# Patient Record
Sex: Male | Born: 1965 | Hispanic: No | Marital: Married | Smoking: Never smoker
Health system: Southern US, Community
[De-identification: ages and names within clinical notes are randomized; demographics above are authoritative.]

---

## 1998-09-02 ENCOUNTER — Emergency Department (HOSPITAL_COMMUNITY): Admission: EM | Admit: 1998-09-02 | Discharge: 1998-09-02 | Payer: Self-pay

## 2001-01-23 ENCOUNTER — Emergency Department (HOSPITAL_COMMUNITY): Admission: EM | Admit: 2001-01-23 | Discharge: 2001-01-23 | Payer: Self-pay | Admitting: Emergency Medicine

## 2001-01-23 ENCOUNTER — Encounter: Payer: Self-pay | Admitting: Emergency Medicine

## 2001-02-01 ENCOUNTER — Emergency Department (HOSPITAL_COMMUNITY): Admission: EM | Admit: 2001-02-01 | Discharge: 2001-02-01 | Payer: Self-pay | Admitting: Emergency Medicine

## 2020-06-02 ENCOUNTER — Emergency Department (HOSPITAL_COMMUNITY)
Admission: EM | Admit: 2020-06-02 | Discharge: 2020-06-03 | Disposition: A | Discharge status: Home / Self Care | Payer: 59 | Attending: Emergency Medicine | Admitting: Emergency Medicine

## 2020-06-02 ENCOUNTER — Other Ambulatory Visit: Payer: Self-pay

## 2020-06-02 ENCOUNTER — Encounter (HOSPITAL_COMMUNITY): Payer: Self-pay | Admitting: Emergency Medicine

## 2020-06-02 DIAGNOSIS — K649 Unspecified hemorrhoids: Secondary | ICD-10-CM | POA: Insufficient documentation

## 2020-06-02 DIAGNOSIS — K611 Rectal abscess: Secondary | ICD-10-CM

## 2020-06-02 DIAGNOSIS — K6289 Other specified diseases of anus and rectum: Secondary | ICD-10-CM | POA: Diagnosis present

## 2020-06-02 DIAGNOSIS — M549 Dorsalgia, unspecified: Secondary | ICD-10-CM

## 2020-06-02 NOTE — ED Triage Notes (Signed)
Patient here from home reporting pain to hemorrhoids, denies bleeding. Hx of same with Chron's

## 2020-06-03 ENCOUNTER — Emergency Department (HOSPITAL_COMMUNITY): Payer: 59

## 2020-06-03 DIAGNOSIS — K611 Rectal abscess: Secondary | ICD-10-CM | POA: Diagnosis not present

## 2020-06-03 LAB — CBC WITH DIFFERENTIAL/PLATELET
Abs Immature Granulocytes: 0.03 10*3/uL (ref 0.00–0.07)
Basophils Absolute: 0 10*3/uL (ref 0.0–0.1)
Basophils Relative: 0 %
Eosinophils Absolute: 0.3 10*3/uL (ref 0.0–0.5)
Eosinophils Relative: 2 %
HCT: 38.5 % — ABNORMAL LOW (ref 39.0–52.0)
Hemoglobin: 13.2 g/dL (ref 13.0–17.0)
Immature Granulocytes: 0 %
Lymphocytes Relative: 18 %
Lymphs Abs: 2.1 10*3/uL (ref 0.7–4.0)
MCH: 28.4 pg (ref 26.0–34.0)
MCHC: 34.3 g/dL (ref 30.0–36.0)
MCV: 82.8 fL (ref 80.0–100.0)
Monocytes Absolute: 0.8 10*3/uL (ref 0.1–1.0)
Monocytes Relative: 7 %
Neutro Abs: 8.2 10*3/uL — ABNORMAL HIGH (ref 1.7–7.7)
Neutrophils Relative %: 73 %
Platelets: 216 10*3/uL (ref 150–400)
RBC: 4.65 MIL/uL (ref 4.22–5.81)
RDW: 12 % (ref 11.5–15.5)
WBC: 11.4 10*3/uL — ABNORMAL HIGH (ref 4.0–10.5)
nRBC: 0 % (ref 0.0–0.2)

## 2020-06-03 LAB — BASIC METABOLIC PANEL
Anion gap: 8 (ref 5–15)
BUN: 16 mg/dL (ref 6–20)
CO2: 27 mmol/L (ref 22–32)
Calcium: 9.2 mg/dL (ref 8.9–10.3)
Chloride: 108 mmol/L (ref 98–111)
Creatinine, Ser: 1.26 mg/dL — ABNORMAL HIGH (ref 0.61–1.24)
GFR, Estimated: >60 mL/min (ref >60)
Glucose, Bld: 106 mg/dL — ABNORMAL HIGH (ref 70–99)
Potassium: 3.9 mmol/L (ref 3.5–5.1)
Sodium: 143 mmol/L (ref 135–145)

## 2020-06-03 MED ORDER — AMOXICILLIN-POT CLAVULANATE 875-125 MG PO TABS
1.0000 | ORAL_TABLET | Freq: Once | ORAL | Status: AC
  Administered 2020-06-03: 1 via ORAL
  Filled 2020-06-03: qty 1

## 2020-06-03 MED ORDER — AMOXICILLIN-POT CLAVULANATE 875-125 MG PO TABS: 1.0000 | ORAL_TABLET | Freq: Two times a day (BID) | ORAL | 0 refills | Status: AC

## 2020-06-03 MED ORDER — LIDOCAINE HCL URETHRAL/MUCOSAL 2 % EX GEL: 1.0000 "application " | CUTANEOUS | 0 refills | Status: AC | PRN

## 2020-06-03 NOTE — ED Notes (Signed)
Pt discharged from this ED in stable condition at this time. All discharge instructions and follow up care reviewed with pt with no further questions at this time. Pt ambulatory with steady gait, clear speech.  

## 2020-06-03 NOTE — ED Provider Notes (Signed)
[facility]-EMERGENCY DEPT Provider Note   CSN: [id number] Arrival date & time: 06/02/20  2050     History Chief Complaint  Patient presents with  . Hemorrhoids    Tom Brown is a 55 y.o. male.  Patient presents to the emergency department with a chief complaint of rectal pain.  He states that he has had the pain worsening for the past couple of days.  He denies any fever, but is noted to have elevated temperature of 100 degrees in triage.  He states that the pain is causing him to have difficulty with bowel movements and he reports difficulty urinating.  He has history of hemorrhoids and Crohn's disease.  He denies any treatments prior to arrival.  Denies any other associated symptoms.  The history is provided by the patient. No language interpreter was used.       History reviewed. No pertinent past medical history.  There are no problems to display for this patient.   History reviewed. No pertinent surgical history.     No family history on file.  Social History   Tobacco Use  . Smoking status: Never Smoker  . Smokeless tobacco: Never Used    Home Medications Prior to Admission medications   Not on File    Allergies    Patient has no allergy information on record.  Review of Systems   Review of Systems  All other systems reviewed and are negative.   Physical Exam Updated Vital Signs BP (!) 156/85 (BP Location: Right Arm)   Pulse 94   Temp 100 F (37.8 C) (Oral)   Resp 18   Ht 5\' 11"  (1.803 m)   Wt 104.9 kg   SpO2 96%   BMI 32.26 kg/m   Physical Exam Vitals and nursing note reviewed.  Constitutional:      Appearance: He is well-developed.  HENT:     Head: Normocephalic and atraumatic.  Eyes:     Conjunctiva/sclera: Conjunctivae normal.  Cardiovascular:     Rate and Rhythm: Normal rate and regular rhythm.     Heart sounds: No murmur heard.   Pulmonary:     Effort: Pulmonary effort is normal. No respiratory  distress.     Breath sounds: Normal breath sounds.  Abdominal:     Palpations: Abdomen is soft.     Tenderness: There is no abdominal tenderness.  Genitourinary:    Comments: Tender indurated area of the rectum Musculoskeletal:        General: Normal range of motion.     Cervical back: Neck supple.  Skin:    General: Skin is warm and dry.  Neurological:     Mental Status: He is alert and oriented to person, place, and time.  Psychiatric:        Mood and Affect: Mood normal.        Behavior: Behavior normal.     ED Results / Procedures / Treatments   Labs (all labs ordered are listed, but only abnormal results are displayed) Labs Reviewed - No data to display  EKG None  Radiology No results found.  Procedures Procedures   Medications Ordered in ED Medications - No data to display  ED Course  I have reviewed the triage vital signs and the nursing notes.  Pertinent labs & imaging results that were available during my care of the patient were reviewed by me and considered in my medical decision making (see chart for details).    MDM Rules/Calculators/A&P  Patient here with rectal pain, questionable perirectal abscess, but given that he is having trouble urinating, will get CT abdomen/pelvis for further evaluation.  CT shows no evidence of large perirectal abscess.  Prostate is unremarkable.  Incidental findings of nonobstructive left nephrolithiasis.  Will treat for developing perirectal abscess with Augmentin.  Recommend warm soaks.  Patient understands and agrees the plan.  Return precautions discussed.  Patient is stable for discharge. Final Clinical Impression(s) / ED Diagnoses Final diagnoses:  Back pain  Perirectal abscess    Rx / DC Orders ED Discharge Orders         Ordered    amoxicillin-clavulanate (AUGMENTIN) 875-125 MG tablet  Every 12 hours        06/03/20 0416    lidocaine (XYLOCAINE) 2 % jelly  As needed        06/03/20  0416           Roxy Horseman, PA-C 06/03/20 0419    Zadie Rhine, MD 06/03/20 [phone]

## 2020-06-03 NOTE — Discharge Instructions (Signed)
The painful spot near your rectum that you are feeling seems most consistent with developing infection, otherwise known as perirectal abscess.  There was no large fluid collection seen on your CT scan that would need to be drained or operated on.  I recommend that you take antibiotics and do warm soaks.  If your symptoms change or worsen, please return to the emergency department.  I would expect your symptoms to be improving in the next couple of days with antibiotic therapy.

## 2021-09-22 IMAGING — CT CT L SPINE W/O CM
3 series · 16 of 33 positions shown, 19 images · IV contrast (agent unspecified)
Comparison: None

CLINICAL DATA: Back pain

EXAM:
CT Lumbar spine without contrast
TECHNIQUE: Multiplanar CT images of the lumbar spine were reconstructed from
contemporary CT of the Chest, Abdomen, and Pelvis
CONTRAST:  None

[Series 9: axial st l-spine · axial · 0.29mm/px · z∈[-350,-119]mm · 8 of 92 slices shown, 10 images]
[im 8/92  soft-tissue]
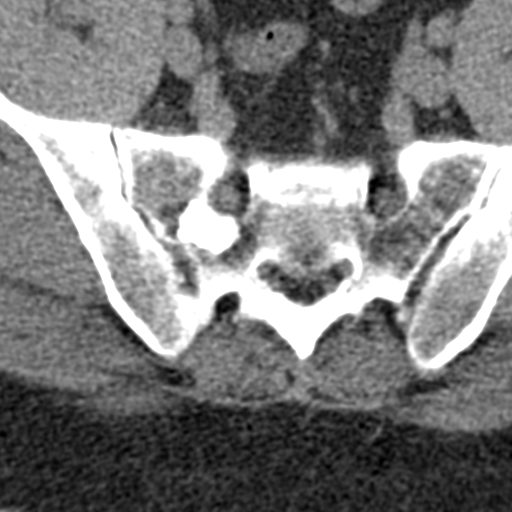
[im 8/92  bone]
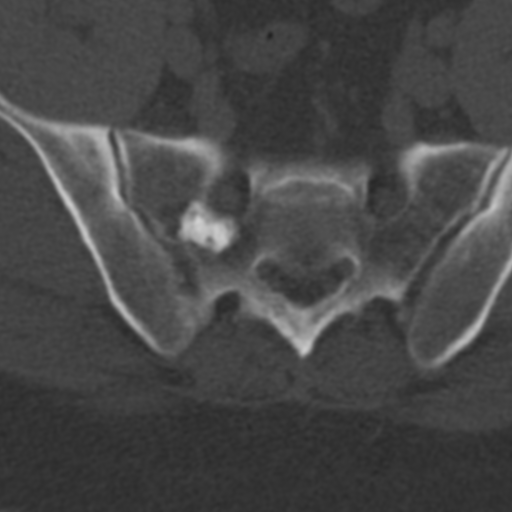
[im 22/92  bone]
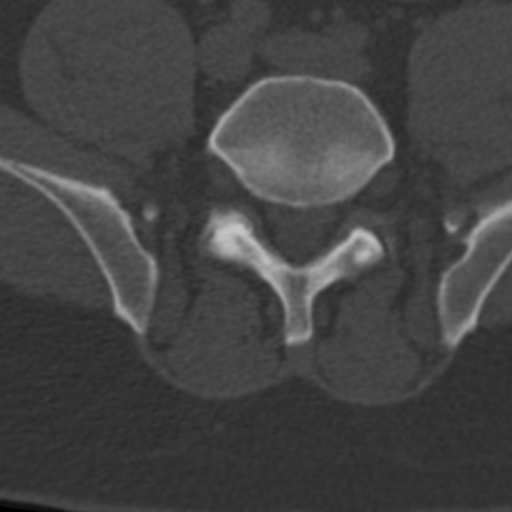
[im 29/92  bone]
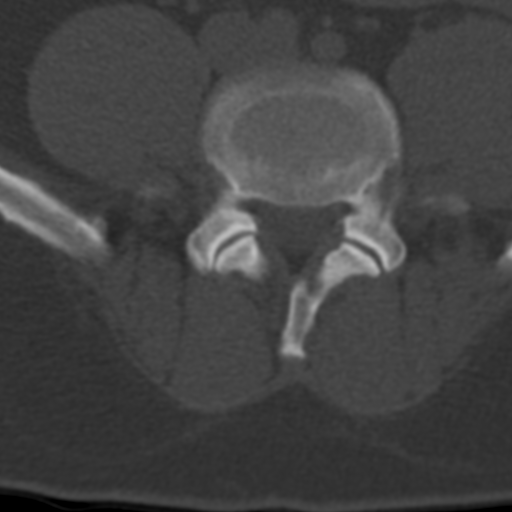
[im 43/92  bone]
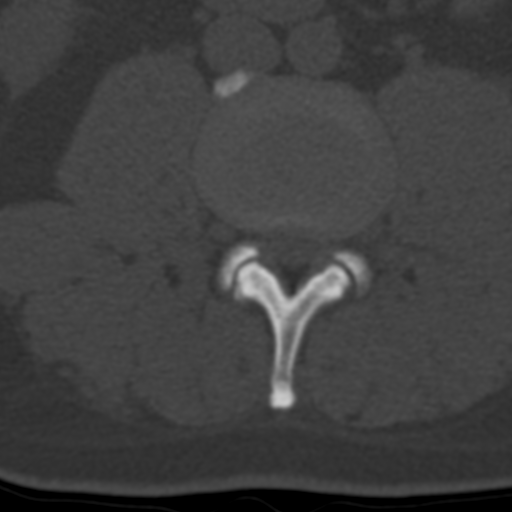
[im 50/92  soft-tissue]
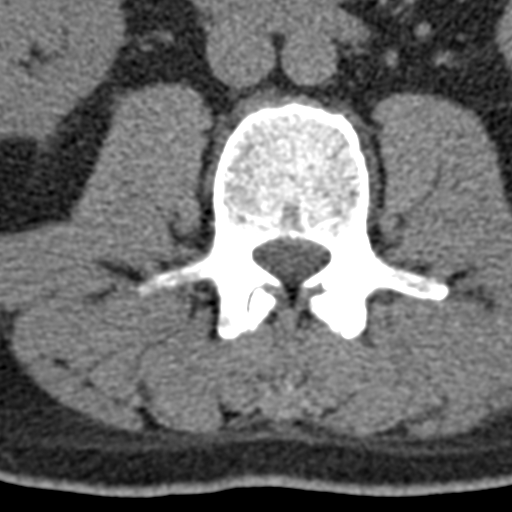
[im 50/92  bone]
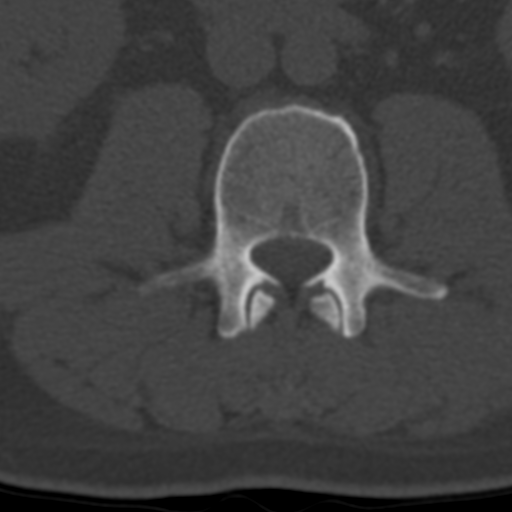
[im 64/92  bone]
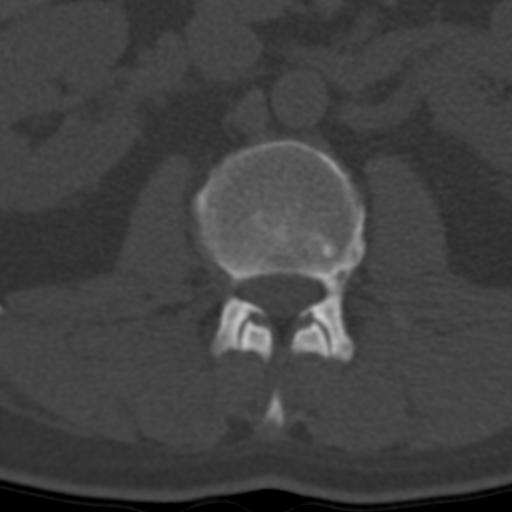
[im 71/92  bone]
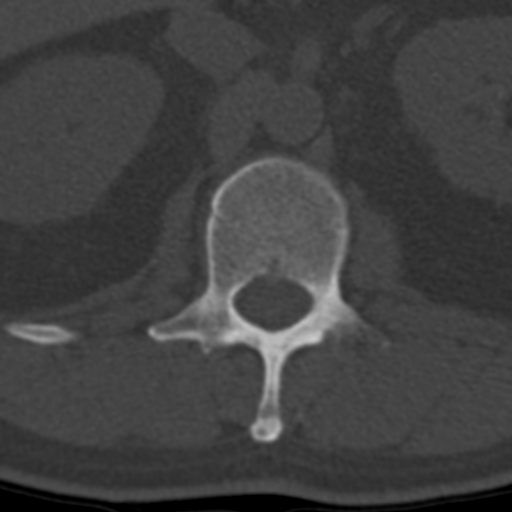
[im 85/92  bone]
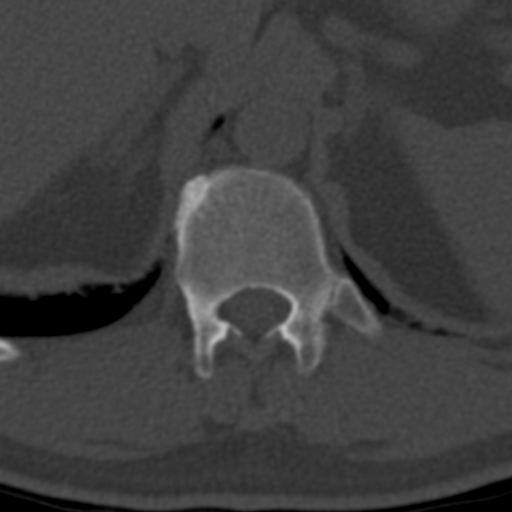

[Series 602: coronal l-spine · coronal · 0.54mm/px · 3 of 65 slices shown]
[im 13/65  bone]
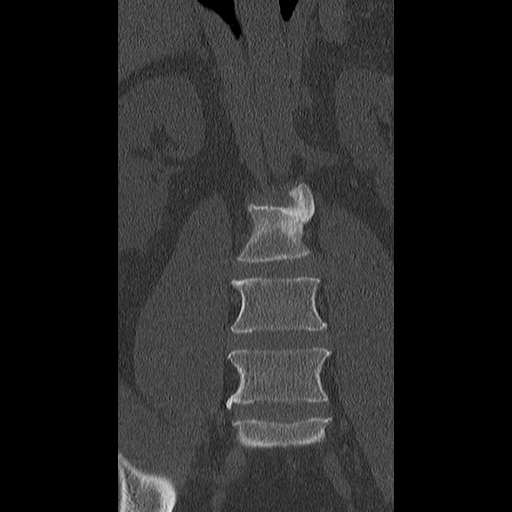
[im 26/65  bone]
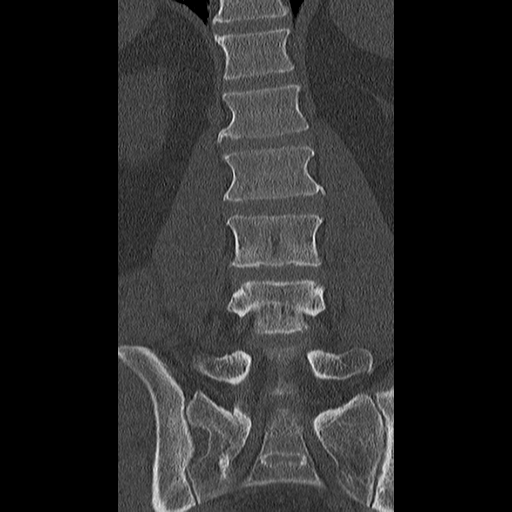
[im 39/65  bone]
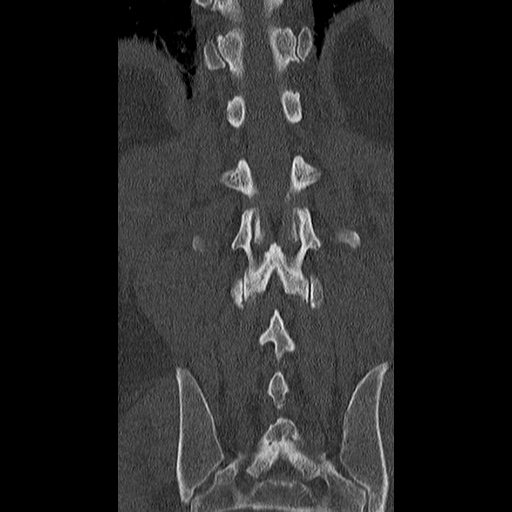

[Series 603: sagittal l-spine · sagittal · 0.54mm/px · 5 of 52 slices shown, 6 images]
[im 18/52  bone]
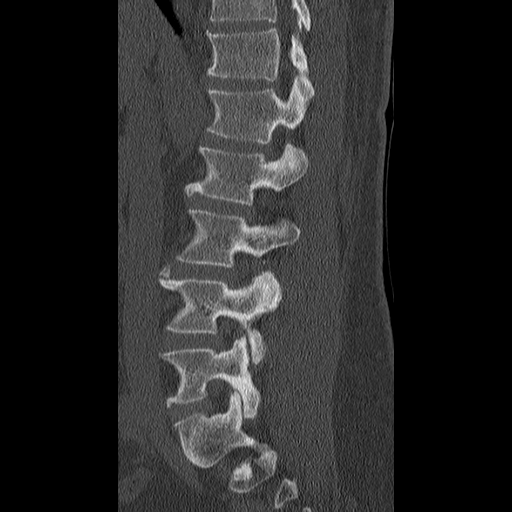
[im 22/52  bone]
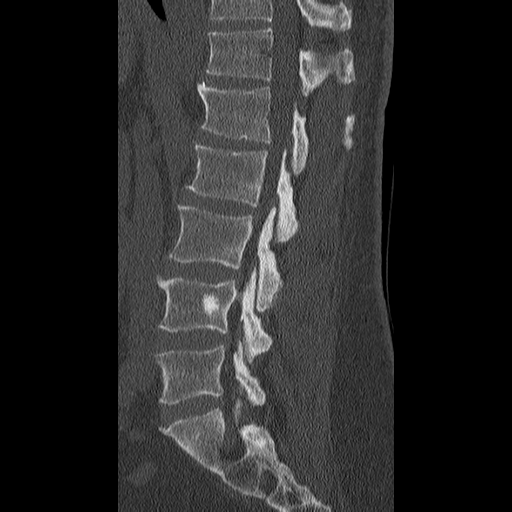
[im 26/52  soft-tissue]
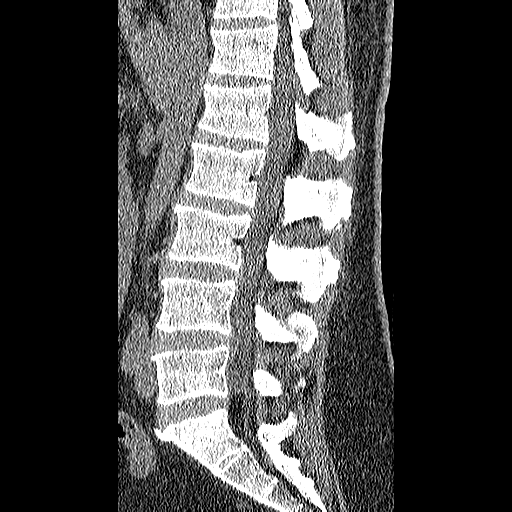
[im 26/52  bone]
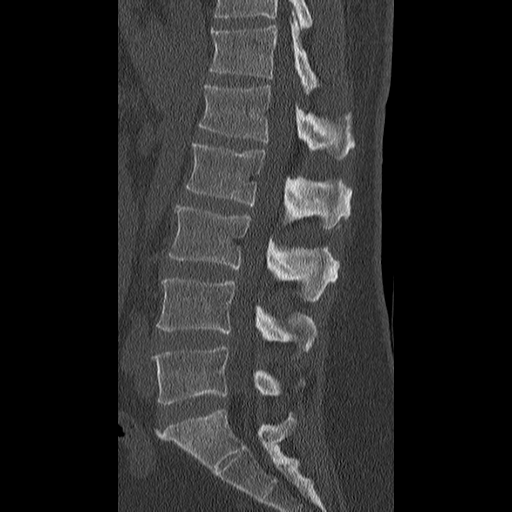
[im 30/52  bone]
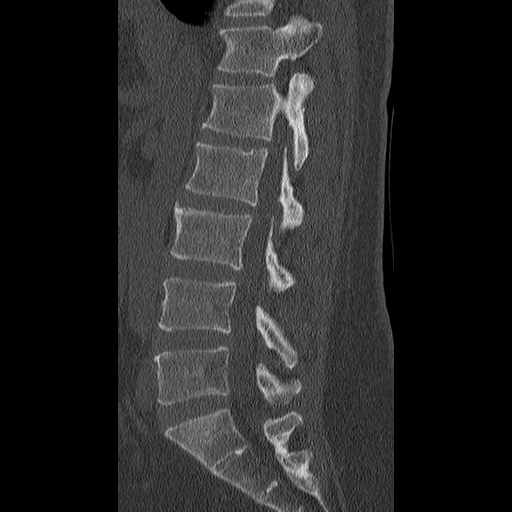
[im 35/52  bone]
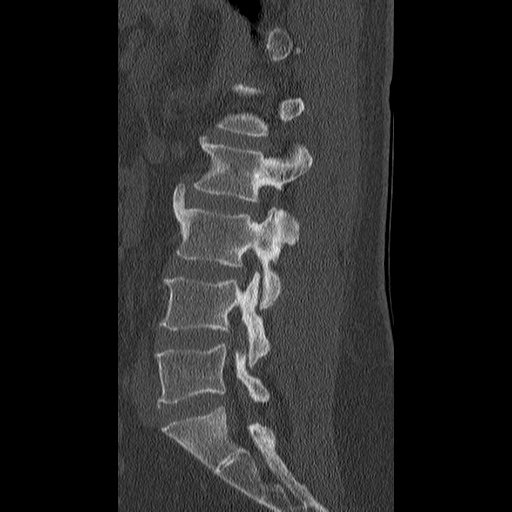

[16 of 33 positions shown; findings below may reference images not displayed]

FINDINGS: Segmentation: 5 lumbar type vertebrae.

Alignment: Normal.

Vertebrae: No acute fracture or focal pathologic process.

Paraspinal and other soft tissues: Negative.

Disc levels: No spinal canal stenosis or neural impingement.
IMPRESSION: No acute abnormality of the lumbar spine.
